# Patient Record
Sex: Female | Born: 1940 | Race: White | Hispanic: No | Marital: Married | Smoking: Former smoker
Health system: Southern US, Community
[De-identification: ages and names within clinical notes are randomized; demographics above are authoritative.]

## PROBLEM LIST (undated history)

## (undated) DIAGNOSIS — M1990 Unspecified osteoarthritis, unspecified site: Secondary | ICD-10-CM

## (undated) DIAGNOSIS — E785 Hyperlipidemia, unspecified: Secondary | ICD-10-CM

## (undated) DIAGNOSIS — H269 Unspecified cataract: Secondary | ICD-10-CM

## (undated) DIAGNOSIS — M545 Low back pain, unspecified: Secondary | ICD-10-CM

## (undated) DIAGNOSIS — R12 Heartburn: Secondary | ICD-10-CM

## (undated) DIAGNOSIS — I10 Essential (primary) hypertension: Secondary | ICD-10-CM

## (undated) DIAGNOSIS — R079 Chest pain, unspecified: Secondary | ICD-10-CM

## (undated) HISTORY — PX: CHOLECYSTECTOMY: SHX55

## (undated) HISTORY — DX: Heartburn: R12

## (undated) HISTORY — DX: Chest pain, unspecified: R07.9

## (undated) HISTORY — DX: Hyperlipidemia, unspecified: E78.5

## (undated) HISTORY — DX: Low back pain, unspecified: M54.50

## (undated) HISTORY — DX: Low back pain: M54.5

## (undated) HISTORY — DX: Unspecified osteoarthritis, unspecified site: M19.90

## (undated) HISTORY — PX: APPENDECTOMY: SHX54

## (undated) HISTORY — PX: CATARACT EXTRACTION: SUR2

## (undated) HISTORY — PX: ABDOMINAL HYSTERECTOMY: SHX81

## (undated) HISTORY — DX: Essential (primary) hypertension: I10

## (undated) HISTORY — PX: TONSILLECTOMY: SUR1361

## (undated) HISTORY — DX: Unspecified cataract: H26.9

## (undated) HISTORY — PX: WISDOM TOOTH EXTRACTION: SHX21

---

## 2017-09-28 ENCOUNTER — Encounter (INDEPENDENT_AMBULATORY_CARE_PROVIDER_SITE_OTHER): Payer: Self-pay

## 2017-09-28 ENCOUNTER — Ambulatory Visit: Payer: Medicare Other | Admitting: Cardiology

## 2017-09-28 ENCOUNTER — Encounter: Payer: Self-pay | Admitting: Cardiology

## 2017-09-28 ENCOUNTER — Encounter: Payer: Self-pay | Admitting: *Deleted

## 2017-09-28 VITALS — BP 120/80 | HR 77 | Ht 65.0 in | Wt 194.0 lb

## 2017-09-28 DIAGNOSIS — E782 Mixed hyperlipidemia: Secondary | ICD-10-CM | POA: Diagnosis not present

## 2017-09-28 DIAGNOSIS — K219 Gastro-esophageal reflux disease without esophagitis: Secondary | ICD-10-CM

## 2017-09-28 DIAGNOSIS — R079 Chest pain, unspecified: Secondary | ICD-10-CM

## 2017-09-28 MED ORDER — METOPROLOL TARTRATE 50 MG PO TABS: ORAL_TABLET | ORAL | 0 refills | Status: DC

## 2017-09-28 NOTE — Progress Notes (Signed)
Cardiology Office Note:    Date:  09/28/2017   ID:  Allison Atkins, DOB 1940-08-18, MRN [id number]  PCP:  Laqueta DueFurr, Sara M., MD  Cardiologist: Tobias AlexanderKatarina Raynetta Osterloh  Referring MD: Judd LienWebb, Meghan H, PA-C   Chief Complaint  Patient presents with  . Chest Pain  . Shortness of Breath   History of Present Illness:    Allison Atkins is a 77 y.o. female with a hx of hyperlipidemia treated with atorvastatin. This is a very pleasant patient who works as a Associate Professorpharmacy tech at [employer] and recently has been under a lot of stress. She has no prior medical history of coronary artery disease or diabetes or blood pressure. She has no family history of premature coronary artery disease either. She has been fairly active and has noticed being tired a lot in the last year. She has developed chest pain that was like elephant sitting on her chest radiating to her left arm and her back while working at work and [facility] ER [facility]. She was ruled out for ACS and referred for cardiology evaluation. The patient denies any prior episodes of chest pain but had sharp pains radiating from her sternal bone to her right and left chest, denies any cough orthopnea proximal nocturnal dyspnea no lower extremity edema other than after long day at work that resolves by next morning. She states that during the episode of chest pain she felt profoundly short of breath felt like she can't catch her breath and pain was bringing her to tears. Her labs were all normal including hemoglobin of 13.3, creatinine 0.78, normal LFTs, triglycerides 142, LDL 85, HDL 50, hemoglobin A1c 5.6% troponin negative 3.  Past Medical History:  Diagnosis Date  . Arthritis   . Cataract   . Chest pain   . Hyperlipidemia   . Hypertension   . Low back pain    acute, right sided with right sided sciatia  . Stomach acid     Past Surgical History:  Procedure Laterality Date  . ABDOMINAL HYSTERECTOMY    . APPENDECTOMY    . CATARACT EXTRACTION    . CHOLECYSTECTOMY      . TONSILLECTOMY    . WISDOM TOOTH EXTRACTION      Current Medications: Current Meds  Medication Sig  . aspirin 81 MG chewable tablet Chew by mouth daily.  Marland Kitchen. atorvastatin (LIPITOR) 10 MG tablet Take 10 mg by mouth daily.  . calcium carbonate (TUMS - DOSED IN MG ELEMENTAL CALCIUM) 500 MG chewable tablet Chew 1 tablet by mouth daily.  . Calcium Carbonate 500 MG CHEW Chew 500 mg by mouth daily.  . Cholecalciferol (VITAMIN D3) 2000 units capsule Take by mouth.  . estradiol (ESTRACE) 0.1 MG/GM vaginal cream Place 1 Applicatorful vaginally at bedtime.  Marland Kitchen. FLUoxetine (PROZAC) 20 MG capsule Take 20 mg by mouth daily.  . Glucosamine-Chondroitin 750-600 MG TABS Take by mouth.  . mesalamine (APRISO) 0.375 g 24 hr capsule Take 375 mg by mouth daily.  . Multiple Vitamins-Minerals (MULTIVITAMIN ADULT PO) Take by mouth daily.  Marland Kitchen. omeprazole (PRILOSEC) 20 MG capsule Take 20 mg by mouth daily.  Marland Kitchen. tiZANidine (ZANAFLEX) 4 MG capsule Take 4 mg by mouth every 8 (eight) hours. As needed for muscles spasms     Allergies:   Patient has no known allergies.   Social History   Socioeconomic History  . Marital status: Married    Spouse name: None  . Number of children: None  . Years of education: None  .  Highest education level: None  Social Needs  . Financial resource strain: None  . Food insecurity - worry: None  . Food insecurity - inability: None  . Transportation needs - medical: None  . Transportation needs - non-medical: None  Occupational History  . None  Tobacco Use  . Smoking status: Former Smoker    Last attempt to quit: 01/27/1983    Years since quitting: 34.6  . Smokeless tobacco: Former Engineer, water and Sexual Activity  . Alcohol use: No    Frequency: Never  . Drug use: No  . Sexual activity: None  Other Topics Concern  . None  Social History Narrative  . None     Family History: The patient's family history includes Birth defects in her father; Cancer in her father;  Cataracts in her mother; Diabetes in her mother.  ROS:   Please see the history of present illness.    All other systems reviewed and are negative.  EKGs/Labs/Other Studies Reviewed:    The following studies were reviewed today:  EKG:  EKG is ordered today.  The ekg ordered today demonstrates normal sinus rhythm, normal EKG no prior EKG available for comparison, this was personally reviewed.  Recent Labs: No results found for requested labs within last 8760 hours.  Recent Lipid Panel No results found for: CHOL, TRIG, HDL, CHOLHDL, VLDL, LDLCALC, LDLDIRECT  Physical Exam:    VS:  BP 120/80 (BP Location: Left Arm, Patient Position: Sitting, Cuff Size: Normal)   Pulse 77   Ht 5\' 5"  (1.651 m)   Wt 194 lb (88 kg)   SpO2 98%   BMI 32.28 kg/m     Wt Readings from Last 3 Encounters:  09/28/17 194 lb (88 kg)     GEN: Well nourished, well developed in no acute distress HEENT: Normal NECK: No JVD; No carotid bruits LYMPHATICS: No lymphadenopathy CARDIAC: RRR, no murmurs, rubs, gallops RESPIRATORY:  Clear to auscultation without rales, wheezing or rhonchi  ABDOMEN: Soft, non-tender, non-distended MUSCULOSKELETAL:  No edema; No deformity  SKIN: Warm and dry NEUROLOGIC:  Alert and oriented x 3 PSYCHIATRIC:  Normal affect   ASSESSMENT:    1. Chest pain, unspecified type   2. Mixed hyperlipidemia   3. Gastroesophageal reflux disease without esophagitis    PLAN:    In order of problems listed above:  1. The patient's symptoms are concerning for typical angina, her risk factors include age and hyperlipidemia. We will proceed with coronary CTA to further evaluate. 2. Hyperlipidemia - well controlled on atorvastatin 10 mg daily and is well-tolerated, we might adjust based on results of coronary CTA.. 3. Gastric reflux, currently on omeprazole 20 mg daily.   Medication Adjustments/Labs and Tests Ordered: Current medicines are reviewed at length with the patient today.  Concerns  regarding medicines are outlined above.  Orders Placed This Encounter  Procedures  . CT CORONARY MORPH W/CTA COR W/SCORE W/CA W/CM &/OR WO/CM  . CT CORONARY FRACTIONAL FLOW RESERVE DATA PREP  . CT CORONARY FRACTIONAL FLOW RESERVE FLUID ANALYSIS  . EKG 12-Lead   Meds ordered this encounter  Medications  . metoprolol tartrate (LOPRESSOR) 50 MG tablet    Sig: Take one tablet by mouth one hour prior to your CT    Dispense:  1 tablet    Refill:  0    Signed, Tobias Alexander, MD  09/28/2017 12:57 PM    [facility]

## 2017-09-28 NOTE — Patient Instructions (Signed)
Medication Instructions:  Your physician recommends that you continue on your current medications as directed. Please refer to the Current Medication list given to you today.  Labwork: None  Testing/Procedures: Your physician recommends that you have a Coronary CTA performed.  Follow-Up: Your physician recommends that you schedule a follow-up appointment in: 3 months with Dr. Delton SeeNelson.   Any Other Special Instructions Will Be Listed Below (If Applicable).  [facility] [address] [place], [place] [address] [phone]  Proceed to the [facility] Radiology Department (First Floor).  Please follow these instructions carefully (unless otherwise directed):   On the Night Before the Test: . Drink plenty of water. . Do not consume any caffeinated/decaffeinated beverages or chocolate 12 hours prior to your test. . Do not take any antihistamines 12 hours prior to your test. . If you take Metformin do not take 24 hours prior to test. . If the patient has contrast allergy: ? Patient will need a prescription for Prednisone and very clear instructions (as follows): 1. Prednisone 50 mg - take 13 hours prior to test 2. Take another Prednisone 50 mg 7 hours prior to test 3. Take another Prednisone 50 mg 1 hour prior to test 4. Take Benadryl 50 mg 1 hour prior to test . Patient must complete all four doses of above prophylactic medications. . Patient will need a ride after test due to Benadryl.  On the Day of the Test: . Drink plenty of water. Do not drink any water within one hour of the test. . Do not eat any food 4 hours prior to the test. . You may take your regular medications prior to the test. . IF NOT ON A BETA BLOCKER - Take 50 mg of lopressor (metoprolol) one hour before the test. . HOLD Furosemide morning of the test.  After the Test: . Drink plenty of water. . After receiving IV contrast, you may experience a mild flushed feeling. This is normal. . On  occasion, you may experience a mild rash up to 24 hours after the test. This is not dangerous. If this occurs, you can take Benadryl 25 mg and increase your fluid intake. . If you experience trouble breathing, this can be serious. If it is severe call 911 IMMEDIATELY. If it is mild, please call our office. . If you take any of these medications: Glipizide/Metformin, Avandament, Glucavance, please do not take 48 hours after completing test.    If you need a refill on your cardiac medications before your next appointment, please call your pharmacy.

## 2017-10-27 ENCOUNTER — Telehealth: Payer: Self-pay | Admitting: *Deleted

## 2017-10-27 DIAGNOSIS — R072 Precordial pain: Secondary | ICD-10-CM

## 2017-10-27 DIAGNOSIS — R079 Chest pain, unspecified: Secondary | ICD-10-CM | POA: Insufficient documentation

## 2017-10-27 DIAGNOSIS — E785 Hyperlipidemia, unspecified: Secondary | ICD-10-CM

## 2017-10-27 NOTE — Telephone Encounter (Signed)
BMET placed for this pt on 10/29/17 per coronary ct protocol. Coronary CT scheduled for 11/05/17.

## 2017-10-29 ENCOUNTER — Other Ambulatory Visit: Payer: Medicare Other

## 2017-10-29 DIAGNOSIS — R072 Precordial pain: Secondary | ICD-10-CM

## 2017-10-29 DIAGNOSIS — E785 Hyperlipidemia, unspecified: Secondary | ICD-10-CM

## 2017-10-29 LAB — BASIC METABOLIC PANEL
BUN/Creatinine Ratio: 16 (ref 12–28)
BUN: 15 mg/dL (ref 8–27)
CO2: 25 mmol/L (ref 20–29)
Calcium: 9.1 mg/dL (ref 8.7–10.3)
Chloride: 106 mmol/L (ref 96–106)
Creatinine, Ser: 0.95 mg/dL (ref 0.57–1.00)
GFR calc Af Amer: 68 mL/min/{1.73_m2} (ref >59)
GFR calc non Af Amer: 59 mL/min/{1.73_m2} — ABNORMAL LOW (ref >59)
Glucose: 83 mg/dL (ref 65–99)
Potassium: 4.7 mmol/L (ref 3.5–5.2)
Sodium: 141 mmol/L (ref 134–144)

## 2017-11-04 DIAGNOSIS — R079 Chest pain, unspecified: Secondary | ICD-10-CM | POA: Diagnosis not present

## 2017-11-05 ENCOUNTER — Ambulatory Visit (HOSPITAL_COMMUNITY)
Admission: RE | Admit: 2017-11-05 | Discharge: 2017-11-05 | Disposition: A | Discharge status: Home / Self Care | Payer: Medicare Other | Source: Ambulatory Visit | Attending: Cardiology | Admitting: Cardiology

## 2017-11-05 ENCOUNTER — Ambulatory Visit (HOSPITAL_COMMUNITY): Admission: RE | Admit: 2017-11-05 | Payer: Medicare Other | Source: Ambulatory Visit

## 2017-11-05 DIAGNOSIS — K449 Diaphragmatic hernia without obstruction or gangrene: Secondary | ICD-10-CM | POA: Diagnosis not present

## 2017-11-05 DIAGNOSIS — R079 Chest pain, unspecified: Secondary | ICD-10-CM | POA: Diagnosis present

## 2017-11-05 DIAGNOSIS — I10 Essential (primary) hypertension: Secondary | ICD-10-CM | POA: Diagnosis not present

## 2017-11-05 DIAGNOSIS — I2510 Atherosclerotic heart disease of native coronary artery without angina pectoris: Secondary | ICD-10-CM | POA: Insufficient documentation

## 2017-11-05 DIAGNOSIS — E785 Hyperlipidemia, unspecified: Secondary | ICD-10-CM | POA: Diagnosis not present

## 2017-11-05 MED ORDER — IOPAMIDOL (ISOVUE-370) INJECTION 76%
INTRAVENOUS | Status: AC
  Filled 2017-11-05: qty 100

## 2017-11-05 MED ORDER — IOPAMIDOL (ISOVUE-370) INJECTION 76%
100.0000 mL | Freq: Once | INTRAVENOUS | Status: AC | PRN
  Administered 2017-11-05: 80 mL via INTRAVENOUS

## 2017-11-05 MED ORDER — NITROGLYCERIN 0.4 MG SL SUBL
0.8000 mg | SUBLINGUAL_TABLET | Freq: Once | SUBLINGUAL | Status: AC
  Administered 2017-11-05: 0.8 mg via SUBLINGUAL

## 2017-11-05 MED ORDER — METOPROLOL TARTRATE 5 MG/5ML IV SOLN
INTRAVENOUS | Status: AC
  Filled 2017-11-05: qty 5

## 2017-11-05 MED ORDER — METOPROLOL TARTRATE 5 MG/5ML IV SOLN
5.0000 mg | Freq: Once | INTRAVENOUS | Status: AC
  Administered 2017-11-05: 5 mg via INTRAVENOUS

## 2017-11-05 MED ORDER — NITROGLYCERIN 0.4 MG SL SUBL
SUBLINGUAL_TABLET | SUBLINGUAL | Status: AC
  Filled 2017-11-05: qty 2

## 2019-04-23 IMAGING — CT CT HEART MORP W/ CTA COR W/ SCORE W/ CA W/CM &/OR W/O CM
4 of 7 series · 8 of 20 positions shown, 9 images · non-contrast
Comparison: Chest CT 02/08/2015

CLINICAL DATA: 77-year-old female with chest pain.

EXAM:
Cardiac/Coronary  CT
TECHNIQUE: The patient was scanned on a Phillips Force scanner.

[Series 7: best diast 72 % · axial · 0.32mm/px · z∈[+1187,+1228]mm · 2 of 306 slices shown, 3 images]
[im 102/306  vessel]
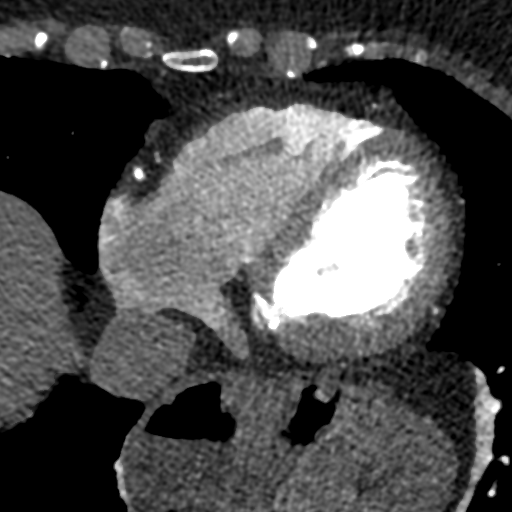
[im 102/306  lung]
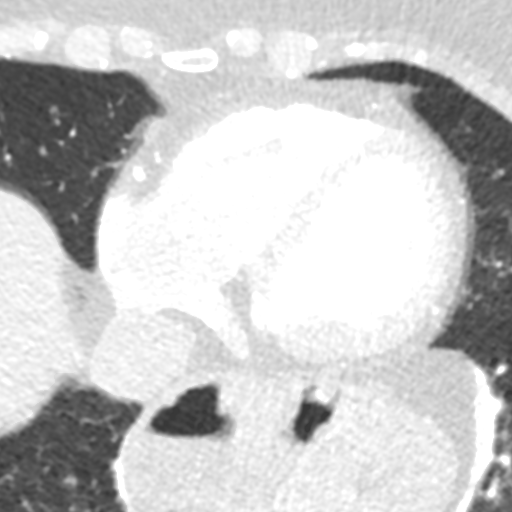
[im 204/306  vessel]
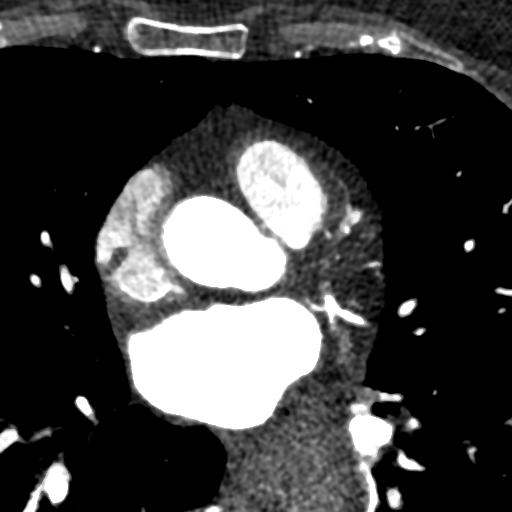

[Series 8: best syst 42 % · axial · 0.32mm/px · z∈[+1187,+1228]mm · 2 of 306 slices shown]
[im 102/306  vessel]
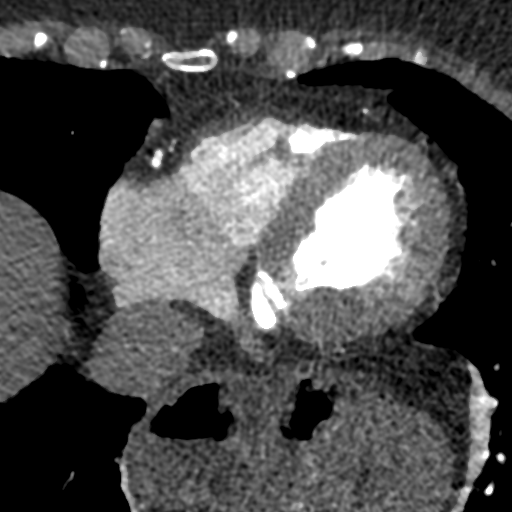
[im 204/306  vessel]
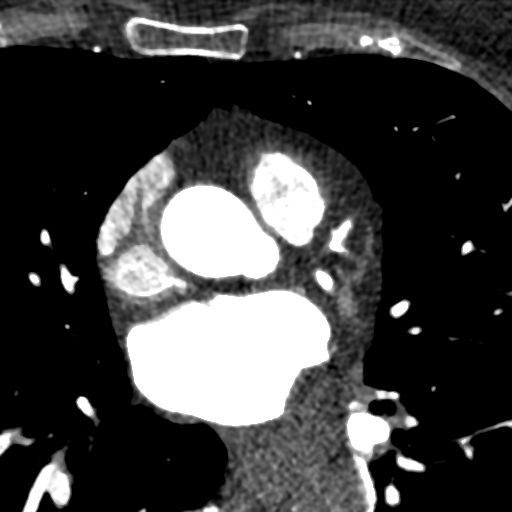

[Series 9: ts diast sharp 72 % · axial · 0.32mm/px · z∈[+1187,+1228]mm · 2 of 306 slices shown]
[im 102/306  lung]
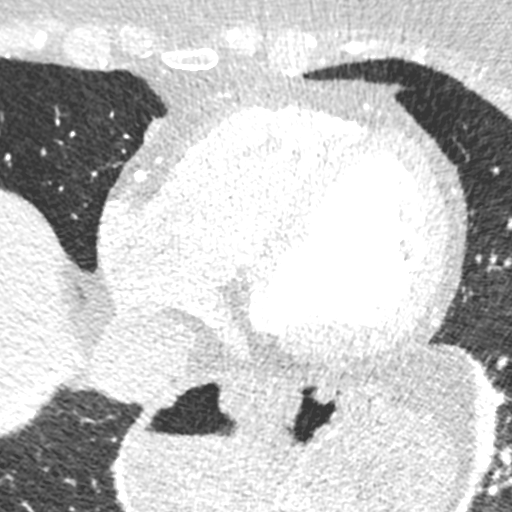
[im 204/306  lung]
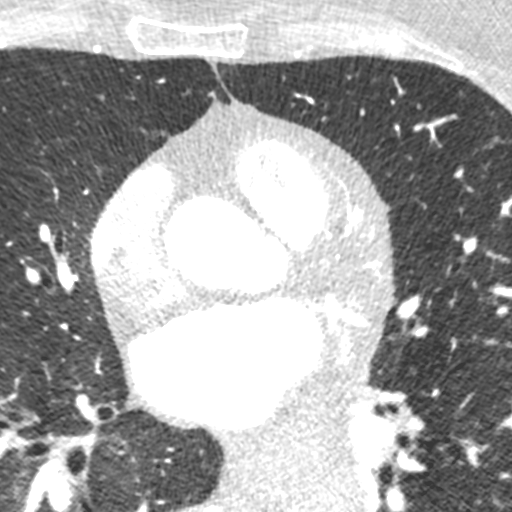

[Series 10: ts syst sharp 42 % · axial · 0.32mm/px · z∈[+1187,+1228]mm · 2 of 306 slices shown]
[im 102/306  lung]
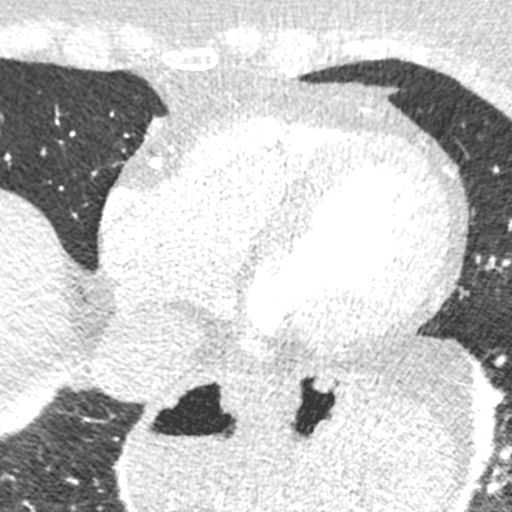
[im 204/306  lung]
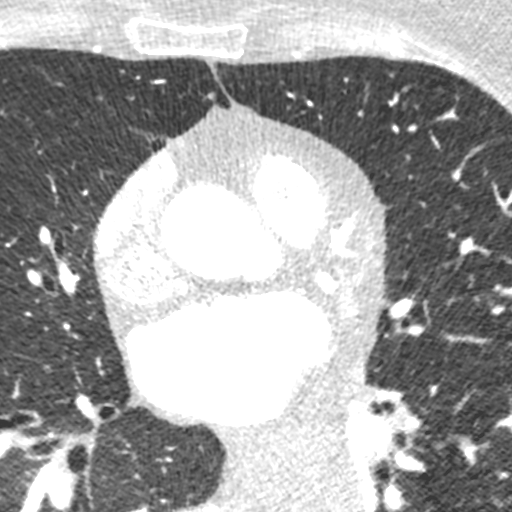

[8 of 20 positions shown; findings below may reference images not displayed]

FINDINGS: A 120 kV prospective scan was triggered in the descending thoracic
aorta at 111 HU's. Axial non-contrast 3 mm slices were carried out
through the heart. The data set was analyzed on a dedicated work
station and scored using the Agatson method. Gantry rotation speed
was 250 msecs and collimation was .6 mm. 10 mg of iv Metoprolol and
0.8 mg of sl NTG was given. The 3D data set was reconstructed in 5%
intervals of the 67-82 % of the R-R cycle. Diastolic phases were
analyzed on a dedicated work station using MPR, MIP and VRT modes.
The patient received 80 cc of contrast.

Aorta:  Normal size.  No calcifications.  No dissection.

Aortic Valve:  Trileaflet.  No calcifications.

Coronary Arteries:  Normal coronary origin.  Right dominance.

RCA is a very large dominant artery that gives rise to PDA and PLVB.
There is no plaque.

Left main is a large artery that gives rise to LAD and LCX arteries.
LM has no plaque.

LAD is a large vessel that gives rise to two small diagonal
arteries. There is a mild calcified plaque in the proximal to mid
LAD wit associated stenosis 25-50%.

LCX is a non-dominant artery that gives rise to one large OM1
branch. There is no plaque.

Other findings:

Normal pulmonary vein drainage into the left atrium.

Normal let atrial appendage without a thrombus.

Normal size of the pulmonary artery.
IMPRESSION: 1. Coronary calcium score of 63. This was 53 percentile for age and
sex matched control.

2. Normal coronary origin with right dominance.

3. Mild CAD in the proximal to mid LAD, otherwise normal coronaries.

EXAM:
OVER-READ INTERPRETATION  CT CHEST

The following report is an over-read performed by radiologist Dr.
Sara Alicia Ortga [REDACTED] on 11/05/2017. This over-read
does not include interpretation of cardiac or coronary anatomy or
pathology. The coronary calcium score/coronary CTA interpretation by
the cardiologist is attached.
FINDINGS: Vascular: Normal caliber of the visualized thoracic aorta. Main
pulmonary arteries are patent without large filling defects. No
significant pericardial fluid.

Mediastinum/Nodes: Again noted is a large hiatal hernia. No enlarged
lymph nodes.

Lungs/Pleura: No large pleural effusions. Stable punctate and
probably calcified nodule in the right middle lobe on sequence 13,
image 16. Stable punctate nodule in the right lower lobe on image
20. These are compatible with benign nodules based on the size and
stability. No significant airspace disease or consolidation in the
visualized lungs.

Upper Abdomen: Stable 1 cm hypodensity in the liver probably
represents a cyst. Otherwise, the visualized upper abdominal
structures are unremarkable.

Musculoskeletal: No acute abnormality.
IMPRESSION: No acute chest abnormality.

Large hiatal hernia.

## 2019-11-22 NOTE — Progress Notes (Signed)
Cardiology Office Note    Date:  11/23/2019   ID:  Allison Atkins, DOB 06-24-1941, MRN [id number]  PCP:  Laqueta Due., MD  Cardiologist: Tobias Alexander, MD EPS: None  No chief complaint on file.   History of Present Illness:  Allison Atkins is a 79 y.o. female with history of hyperlipidemia on atorvastatin, history of chest pain  11/2017 Coronary CTA calcium score 63 which was 53 percentile for age and sex match results, mild CAD in the proximal to mid LAD otherwise normal.  Patient last saw Dr. Delton See in 2019.  Patient comes in for f/u. 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 No regular exercise-had been walking 2 miles but got away from it. Received her covid 19 vaccine.   Past Medical History:  Diagnosis Date  . Arthritis   . Cataract   . Chest pain   . Hyperlipidemia   . Hypertension   . Low back pain    acute, right sided with right sided sciatia  . Stomach acid     Past Surgical History:  Procedure Laterality Date  . ABDOMINAL HYSTERECTOMY    . APPENDECTOMY    . CATARACT EXTRACTION    . CHOLECYSTECTOMY    . TONSILLECTOMY    . WISDOM TOOTH EXTRACTION      Current Medications: Current Meds  Medication Sig  . aspirin 81 MG chewable tablet Chew by mouth daily.  Marland Kitchen atorvastatin (LIPITOR) 10 MG tablet Take 10 mg by mouth daily.  . calcium carbonate (TUMS - DOSED IN MG ELEMENTAL CALCIUM) 500 MG chewable tablet Chew 1 tablet by mouth daily.  . Calcium Carbonate 500 MG CHEW Chew 500 mg by mouth daily.  . Cholecalciferol (VITAMIN D3) 2000 units capsule Take by mouth.  . estradiol (ESTRACE) 0.1 MG/GM vaginal cream Place 1 Applicatorful vaginally at bedtime.  Marland Kitchen FLUoxetine (PROZAC) 20 MG capsule Take 20 mg by mouth daily.  . Glucosamine-Chondroitin 750-600 MG TABS Take by mouth.  . Multiple Vitamins-Minerals (MULTIVITAMIN ADULT PO) Take by mouth daily.  Marland Kitchen omeprazole (PRILOSEC) 20 MG capsule Take 20 mg by mouth daily.  .  [DISCONTINUED] mesalamine (APRISO) 0.375 g 24 hr capsule Take 375 mg by mouth daily.  . [DISCONTINUED] metoprolol tartrate (LOPRESSOR) 50 MG tablet Take one tablet by mouth one hour prior to your CT  . [DISCONTINUED] tiZANidine (ZANAFLEX) 4 MG capsule Take 4 mg by mouth every 8 (eight) hours. As needed for muscles spasms     Allergies:   Patient has no known allergies.   Social History   Socioeconomic History  . Marital status: Married    Spouse name: Not on file  . Number of children: Not on file  . Years of education: Not on file  . Highest education level: Not on file  Occupational History  . Not on file  Tobacco Use  . Smoking status: Former Smoker    Quit date: 01/27/1983    Years since quitting: 36.8  . Smokeless tobacco: Former Engineer, water and Sexual Activity  . Alcohol use: No  . Drug use: No  . Sexual activity: Not on file  Other Topics Concern  . Not on file  Social History Narrative  . Not on file   Social Determinants of Health   Financial Resource Strain:   . Difficulty of Paying Living Expenses:   Food Insecurity:   . Worried About Programme researcher, broadcasting/film/video in the Last Year:   . Ran  Out of Food in the Last Year:   Transportation Needs:   . Lack of Transportation (Medical):   Marland Kitchen Lack of Transportation (Non-Medical):   Physical Activity:   . Days of Exercise per Week:   . Minutes of Exercise per Session:   Stress:   . Feeling of Stress :   Social Connections:   . Frequency of Communication with Friends and Family:   . Frequency of Social Gatherings with Friends and Family:   . Attends Religious Services:   . Active Member of Clubs or Organizations:   . Attends Banker Meetings:   Marland Kitchen Marital Status:      Family History:  The patient's family history includes Birth defects in her father; Cancer in her father; Cataracts in her mother; Diabetes in her mother.   ROS:   Please see the history of present illness.    ROS All other systems  reviewed and are negative.   PHYSICAL EXAM:   VS:  BP 122/64   Pulse 80   Ht 5\' 5"  (1.651 m)   Wt 160 lb (72.6 kg)   SpO2 96%   BMI 26.63 kg/m   Physical Exam  GEN: Well nourished, well developed, in no acute distress  Neck: no JVD, carotid bruits, or masses Cardiac:RRR; no murmurs, rubs, or gallops  Respiratory:  clear to auscultation bilaterally, normal work of breathing GI: soft, nontender, nondistended, + BS Ext: without cyanosis, clubbing, or edema, Good distal pulses bilaterally Neuro:  Alert and Oriented x 3 Psych: euthymic mood, full affect  Wt Readings from Last 3 Encounters:  11/23/19 160 lb (72.6 kg)  09/28/17 194 lb (88 kg)      Studies/Labs Reviewed:   EKG:  EKG is  ordered today.  The ekg ordered today demonstrates normal sinus rhythm, normal EKG  Recent Labs: No results found for requested labs within last 8760 hours.   Lipid Panel No results found for: CHOL, TRIG, HDL, CHOLHDL, VLDL, LDLCALC, LDLDIRECT  Additional studies/ records that were reviewed today include:  Coronary CTA 4/4/2019IMPRESSION: 1. Coronary calcium score of 63. This was 63 percentile for age and sex matched control.   2. Normal coronary origin with right dominance.   3. 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     Electronically Signed   By: 40   On: 11/05/2017 12:20       ASSESSMENT:    1. Coronary artery disease involving native coronary artery of native heart without angina pectoris   2. Hyperlipidemia, unspecified hyperlipidemia type      PLAN:  In order of problems listed above:  CAD mild in the proximal to mid LAD on coronary CTA 11/2017 no chest pain, on a statin, has lost 30 pounds since she was here last, recommend 150 minutes of exercise weekly  Hyperlipidemia LDL 90 on labs in care everywhere 10/2019 continue Lipitor 10 mg daily    Medication Adjustments/Labs and Tests Ordered: Current medicines are reviewed at  length with the patient today.  Concerns regarding medicines are outlined above.  Medication changes, Labs and Tests ordered today are listed in the Patient Instructions below. Patient Instructions  Medication Instructions:  Your physician recommends that you continue on your current medications as directed. Please refer to the Current Medication list given to you today.  *If you need a refill on your cardiac medications before your next appointment, please call your pharmacy*   Lab Work: None ordered  If you have  labs (blood work) drawn today and your tests are completely normal, you will receive your results only by: Marland Kitchen MyChart Message (if you have MyChart) OR . A paper copy in the mail If you have any lab test that is abnormal or we need to change your treatment, we will call you to review the results.   Testing/Procedures: None ordered   Follow-Up: At [facility], you and your health needs are our priority.  As part of our continuing mission to provide you with exceptional heart care, we have created designated Provider Care Teams.  These Care Teams include your primary Cardiologist (physician) and Advanced Practice Providers (APPs -  Physician Assistants and Nurse Practitioners) who all work together to provide you with the care you need, when you need it.  We recommend signing up for the patient portal called "MyChart".  Sign up information is provided on this After Visit Summary.  MyChart is used to connect with patients for Virtual Visits (Telemedicine).  Patients are able to view lab/test results, encounter notes, upcoming appointments, etc.  Non-urgent messages can be sent to your provider as well.   To learn more about what you can do with MyChart, go to [email or web address].    Your next appointment:   12 month(s)  The format for your next appointment:   In Person  Provider:   You may see Ena Dawley, MD or one of the following Advanced Practice Providers on  your designated Care Team:    Melina Copa, PA-C  Ermalinda Barrios, PA-C    Other Instructions Your provider recommends that you maintain 150 minutes per week of moderate aerobic activity.      Signed, Ermalinda Barrios, PA-C  11/23/2019 2:13 PM    [facility] [place], [place], [place]  [address] Phone: [phone]; Fax: [phone]

## 2019-11-23 ENCOUNTER — Other Ambulatory Visit: Payer: Self-pay

## 2019-11-23 ENCOUNTER — Ambulatory Visit: Payer: Medicare Other | Admitting: Physician Assistant

## 2019-11-23 ENCOUNTER — Encounter: Payer: Self-pay | Admitting: Physician Assistant

## 2019-11-23 VITALS — BP 122/64 | HR 80 | Ht 65.0 in | Wt 160.0 lb

## 2019-11-23 DIAGNOSIS — E785 Hyperlipidemia, unspecified: Secondary | ICD-10-CM

## 2019-11-23 DIAGNOSIS — I2510 Atherosclerotic heart disease of native coronary artery without angina pectoris: Secondary | ICD-10-CM | POA: Diagnosis not present

## 2019-11-23 NOTE — Patient Instructions (Signed)
Medication Instructions:  Your physician recommends that you continue on your current medications as directed. Please refer to the Current Medication list given to you today.  *If you need a refill on your cardiac medications before your next appointment, please call your pharmacy*   Lab Work: None ordered  If you have labs (blood work) drawn today and your tests are completely normal, you will receive your results only by: . MyChart Message (if you have MyChart) OR . A paper copy in the mail If you have any lab test that is abnormal or we need to change your treatment, we will call you to review the results.   Testing/Procedures: None ordered   Follow-Up: At [facility], you and your health needs are our priority.  As part of our continuing mission to provide you with exceptional heart care, we have created designated Provider Care Teams.  These Care Teams include your primary Cardiologist (physician) and Advanced Practice Providers (APPs -  Physician Assistants and Nurse Practitioners) who all work together to provide you with the care you need, when you need it.  We recommend signing up for the patient portal called "MyChart".  Sign up information is provided on this After Visit Summary.  MyChart is used to connect with patients for Virtual Visits (Telemedicine).  Patients are able to view lab/test results, encounter notes, upcoming appointments, etc.  Non-urgent messages can be sent to your provider as well.   To learn more about what you can do with MyChart, go to [email or web address].    Your next appointment:   12 month(s)  The format for your next appointment:   In Person  Provider:   You may see Katarina Nelson, MD or one of the following Advanced Practice Providers on your designated Care Team:    Dayna Dunn, PA-C  Michele Lenze, PA-C    Other Instructions Your provider recommends that you maintain 150 minutes per week of moderate aerobic activity.
